# Patient Record
Sex: Male | Born: 2000 | Race: White | Hispanic: No | Marital: Single | Smoking: Never smoker
Health system: Southern US, Community
[De-identification: ages and names within clinical notes are randomized; demographics above are authoritative.]

## PROBLEM LIST (undated history)

## (undated) DIAGNOSIS — K5090 Crohn's disease, unspecified, without complications: Secondary | ICD-10-CM

## (undated) DIAGNOSIS — T7840XA Allergy, unspecified, initial encounter: Secondary | ICD-10-CM

## (undated) DIAGNOSIS — R109 Unspecified abdominal pain: Secondary | ICD-10-CM

## (undated) DIAGNOSIS — D649 Anemia, unspecified: Secondary | ICD-10-CM

## (undated) HISTORY — PX: INGUINAL HERNIA REPAIR: SUR1180

## (undated) HISTORY — DX: Anemia, unspecified: D64.9

## (undated) HISTORY — PX: TESTICLE SURGERY: SHX794

---
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
## 2023-06-06 ENCOUNTER — Telehealth: Payer: Self-pay

## 2023-06-06 ENCOUNTER — Ambulatory Visit (INDEPENDENT_AMBULATORY_CARE_PROVIDER_SITE_OTHER): Payer: 59 | Admitting: Pediatrics

## 2023-06-06 ENCOUNTER — Encounter: Payer: Self-pay | Admitting: Pediatrics

## 2023-06-06 VITALS — BP 120/80 | HR 78 | Ht 71.0 in | Wt 196.0 lb

## 2023-06-06 DIAGNOSIS — Z8619 Personal history of other infectious and parasitic diseases: Secondary | ICD-10-CM | POA: Diagnosis not present

## 2023-06-06 DIAGNOSIS — Z8639 Personal history of other endocrine, nutritional and metabolic disease: Secondary | ICD-10-CM

## 2023-06-06 DIAGNOSIS — Z862 Personal history of diseases of the blood and blood-forming organs and certain disorders involving the immune mechanism: Secondary | ICD-10-CM

## 2023-06-06 DIAGNOSIS — K56609 Unspecified intestinal obstruction, unspecified as to partial versus complete obstruction: Secondary | ICD-10-CM

## 2023-06-06 DIAGNOSIS — Z7960 Long term (current) use of unspecified immunomodulators and immunosuppressants: Secondary | ICD-10-CM

## 2023-06-06 DIAGNOSIS — K50919 Crohn's disease, unspecified, with unspecified complications: Secondary | ICD-10-CM

## 2023-06-06 DIAGNOSIS — K50813 Crohn's disease of both small and large intestine with fistula: Secondary | ICD-10-CM | POA: Diagnosis not present

## 2023-06-06 NOTE — Patient Instructions (Addendum)
 Your provider has requested that you go to the basement level for lab work before leaving today. Press "B" on the elevator. The lab is located at the first door on the left as you exit the elevator.  Due to recent changes in healthcare laws, you may see the results of your imaging and laboratory studies on MyChart before your provider has had a chance to review them.  We understand that in some cases there may be results that are confusing or concerning to you. Not all laboratory results come back in the same time frame and the provider may be waiting for multiple results in order to interpret others.  Please give us  48 hours in order for your provider to thoroughly review all the results before contacting the office for clarification of your results.    Follow up in 6 months.  Thank you for entrusting me with your care and for choosing [facility],  Dr. Eugenia Hess   _______________________________________________________  If your blood pressure at your visit was 140/90 or greater, please contact your primary care physician to follow up on this.  _______________________________________________________  If you are age 21 or older, your body mass index should be between 23-30. Your Body mass index is 27.34 kg/m. If this is out of the aforementioned range listed, please consider follow up with your Primary Care Provider.  If you are age 82 or younger, your body mass index should be between 19-25. Your Body mass index is 27.34 kg/m. If this is out of the aformentioned range listed, please consider follow up with your Primary Care Provider.   ________________________________________________________  The [facility] providers would like to encourage you to use MYCHART to communicate with providers for non-urgent requests or questions.  Due to long hold times on the telephone, sending your provider a message by [facility] may be a faster and more efficient way to get a response.  Please  allow 48 business hours for a response.  Please remember that this is for non-urgent requests.  _______________________________________________________

## 2023-06-06 NOTE — Telephone Encounter (Signed)
 Dr. Yvone Herd requested Kevin Avila's Humira be refilled as adalimumab-ADAZ 40 mg sq every 2 weeks.  For 6 months

## 2023-06-07 MED ORDER — ADALIMUMAB-ADAZ 40 MG/0.4ML ~~LOC~~ SOAJ: 1.0000 | SUBCUTANEOUS | 5 refills | Status: DC

## 2023-06-07 NOTE — Telephone Encounter (Signed)
 adalimumab-ADAZ has been refilled for 6 months.

## 2023-06-11 ENCOUNTER — Encounter: Payer: Self-pay | Admitting: Pediatrics

## 2023-06-19 ENCOUNTER — Telehealth: Payer: Self-pay

## 2023-06-19 ENCOUNTER — Other Ambulatory Visit (HOSPITAL_COMMUNITY): Payer: Self-pay

## 2023-06-19 NOTE — Telephone Encounter (Signed)
 Pharmacy Patient Advocate Encounter   Received notification from CoverMyMeds that prior authorization for Adalimumab -adaz 40MG /0.4ML auto-injectors is required/requested.   Insurance verification completed.   The patient is insured through [facility] .   Per test claim: PA required; PA submitted to above mentioned insurance via CoverMyMeds Key/confirmation #/EOC [id number] Status is pending

## 2023-06-19 NOTE — Telephone Encounter (Signed)
 Noted.

## 2023-06-19 NOTE — Telephone Encounter (Signed)
 Pharmacy Patient Advocate Encounter  Received notification from [employer] that Prior Authorization for Adalimumab -adaz 40MG /0.4ML auto-injectors has been APPROVED from 06-19-2023 to 06-18-2024   PA #/Case ID/Reference #: [id number]

## 2023-07-04 ENCOUNTER — Other Ambulatory Visit: Payer: Self-pay | Admitting: Pediatrics

## 2023-07-05 ENCOUNTER — Ambulatory Visit: Payer: Self-pay | Admitting: Pediatrics

## 2023-07-07 ENCOUNTER — Other Ambulatory Visit: Payer: Self-pay

## 2023-07-07 DIAGNOSIS — K50813 Crohn's disease of both small and large intestine with fistula: Secondary | ICD-10-CM

## 2023-07-10 ENCOUNTER — Other Ambulatory Visit

## 2023-07-10 DIAGNOSIS — K50813 Crohn's disease of both small and large intestine with fistula: Secondary | ICD-10-CM

## 2023-07-11 ENCOUNTER — Encounter: Payer: Self-pay | Admitting: Pediatrics

## 2023-07-11 ENCOUNTER — Ambulatory Visit: Payer: Self-pay | Admitting: Pediatrics

## 2023-07-11 LAB — COMPREHENSIVE METABOLIC PANEL WITH GFR
ALT: 33 IU/L (ref 0–44)
AST: 30 IU/L (ref 0–40)
Albumin: 5 g/dL (ref 4.3–5.2)
Alkaline Phosphatase: 98 IU/L (ref 44–121)
BUN/Creatinine Ratio: 11 (ref 9–20)
BUN: 8 mg/dL (ref 6–20)
Bilirubin Total: 0.2 mg/dL (ref 0.0–1.2)
CO2: 20 mmol/L (ref 20–29)
Calcium: 9.6 mg/dL (ref 8.7–10.2)
Chloride: 102 mmol/L (ref 96–106)
Creatinine, Ser: 0.75 mg/dL — ABNORMAL LOW (ref 0.76–1.27)
Globulin, Total: 2.8 g/dL (ref 1.5–4.5)
Glucose: 82 mg/dL (ref 70–99)
Potassium: 4 mmol/L (ref 3.5–5.2)
Sodium: 138 mmol/L (ref 134–144)
Total Protein: 7.8 g/dL (ref 6.0–8.5)
eGFR: 132 mL/min/{1.73_m2} (ref >59)

## 2023-07-11 LAB — CBC WITH DIFFERENTIAL/PLATELET
Basophils Absolute: 0 10*3/uL (ref 0.0–0.2)
Basos: 0 %
EOS (ABSOLUTE): 0.1 10*3/uL (ref 0.0–0.4)
Eos: 1 %
Hematocrit: 34.6 % — ABNORMAL LOW (ref 37.5–51.0)
Hemoglobin: 9.9 g/dL — ABNORMAL LOW (ref 13.0–17.7)
Immature Grans (Abs): 0 10*3/uL (ref 0.0–0.1)
Immature Granulocytes: 0 %
Lymphocytes Absolute: 2.3 10*3/uL (ref 0.7–3.1)
Lymphs: 33 %
MCH: 19.3 pg — ABNORMAL LOW (ref 26.6–33.0)
MCHC: 28.6 g/dL — ABNORMAL LOW (ref 31.5–35.7)
MCV: 67 fL — ABNORMAL LOW (ref 79–97)
Monocytes Absolute: 0.5 10*3/uL (ref 0.1–0.9)
Monocytes: 7 %
Neutrophils Absolute: 4.1 10*3/uL (ref 1.4–7.0)
Neutrophils: 59 %
Platelets: 179 10*3/uL (ref 150–450)
RBC: 5.13 x10E6/uL (ref 4.14–5.80)
RDW: 13.7 % (ref 11.6–15.4)
WBC: 7 10*3/uL (ref 3.4–10.8)

## 2023-07-11 LAB — QUANTIFERON-TB GOLD PLUS
QuantiFERON Mitogen Value: >10 [IU]/mL
QuantiFERON Nil Value: 0.05 [IU]/mL
QuantiFERON TB1 Ag Value: 0.04 [IU]/mL
QuantiFERON TB2 Ag Value: 0.05 [IU]/mL
QuantiFERON-TB Gold Plus: NEGATIVE

## 2023-07-11 LAB — C-REACTIVE PROTEIN: CRP: <1 mg/L (ref 0–10)

## 2023-07-11 LAB — IRON AND TIBC
Iron Saturation: 2 % — CL (ref 15–55)
Iron: 12 ug/dL — ABNORMAL LOW (ref 38–169)
Total Iron Binding Capacity: 520 ug/dL — ABNORMAL HIGH (ref 250–450)
UIBC: 508 ug/dL — ABNORMAL HIGH (ref 111–343)

## 2023-07-11 LAB — ADALIMUMAB+AB (SERIAL MONITOR)
Adalimumab Drug Level: 8.5 ug/mL
Anti-Adalimumab Antibody: <25 ng/mL

## 2023-07-11 LAB — VITAMIN D 25 HYDROXY (VIT D DEFICIENCY, FRACTURES): Vit D, 25-Hydroxy: 25.7 ng/mL — ABNORMAL LOW (ref 30.0–100.0)

## 2023-07-11 LAB — IGA: Immunoglobulin A: 283 mg/dL (ref 47–310)

## 2023-07-11 LAB — HEPATITIS B CORE ANTIBODY, TOTAL: Hep B Core Total Ab: NEGATIVE

## 2023-07-11 LAB — HEPATITIS B SURFACE ANTIBODY,QUALITATIVE: Hep B Surface Ab, Qual: NONREACTIVE

## 2023-07-11 LAB — TISSUE TRANSGLUTAMINASE ABS,IGG,IGA
(tTG) Ab, IgA: <1 U/mL
(tTG) Ab, IgG: <1 U/mL

## 2023-07-11 LAB — HEPATITIS B SURFACE ANTIGEN: Hepatitis B Surface Ag: NEGATIVE

## 2023-07-11 LAB — B12 AND FOLATE PANEL
Folate: 11.2 ng/mL (ref >3.0)
Vitamin B-12: 366 pg/mL (ref 232–1245)

## 2023-07-11 LAB — SERIAL MONITORING

## 2023-07-11 LAB — FERRITIN: Ferritin: 5 ng/mL — ABNORMAL LOW (ref 30–400)

## 2023-07-11 LAB — SEDIMENTATION RATE: Sed Rate: 26 mm/h — ABNORMAL HIGH (ref 0–15)

## 2023-07-14 ENCOUNTER — Other Ambulatory Visit

## 2023-07-14 DIAGNOSIS — K50813 Crohn's disease of both small and large intestine with fistula: Secondary | ICD-10-CM

## 2023-07-16 LAB — H. PYLORI ANTIGEN, STOOL: H pylori Ag, Stl: NEGATIVE

## 2023-07-17 LAB — CALPROTECTIN, FECAL: Calprotectin, Fecal: 55 ug/g (ref 0–120)

## 2023-08-25 ENCOUNTER — Ambulatory Visit (AMBULATORY_SURGERY_CENTER)

## 2023-08-25 VITALS — Ht 71.0 in | Wt 200.0 lb

## 2023-08-25 DIAGNOSIS — K50813 Crohn's disease of both small and large intestine with fistula: Secondary | ICD-10-CM

## 2023-08-25 MED ORDER — NA SULFATE-K SULFATE-MG SULF 17.5-3.13-1.6 GM/177ML PO SOLN: 1.0000 | Freq: Once | ORAL | 0 refills | Status: AC

## 2023-08-25 NOTE — Progress Notes (Signed)
 No egg or soy allergy known to patient  No issues known to pt with past sedation with any surgeries or procedures Patient denies ever being told they had issues or difficulty with intubation  No FH of Malignant Hyperthermia Pt is not on diet pills Pt is not on  home 02  Pt is not on blood thinners  Pt denies issues with constipation  No A fib or A flutter Have any cardiac testing pending--no Pt can ambulate independently Pt denies use of chewing tobacco Discussed diabetic I weight loss medication holds Discussed NSAID holds Checked BMI Pt instructed to use Singlecare.com or GoodRx for a price reduction on prep  Patient's chart reviewed by Kevin Avila CNRA prior to previsit and patient appropriate for the [facility].  Pre visit completed and red dot placed by patient's name on their procedure day (on provider's schedule).

## 2023-08-28 ENCOUNTER — Encounter: Payer: Self-pay | Admitting: Pediatrics

## 2023-09-08 ENCOUNTER — Encounter: Admitting: Pediatrics

## 2023-09-14 NOTE — Progress Notes (Unsigned)
 [facility] History and Physical   Primary Care Physician:  Podraza, Cole Christopher, PA-C   Reason for Procedure:  History of Crohn's disease, iron deficiency anemia  Plan:    EGD and colonoscopy     HPI: Kevin Avila is a 23 y.o. male undergoing EGD and colonoscopy for a history of Crohn's disease and iron deficiency anemia.  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  He is currently on adalimumab  40 mg subcutaneously q. 14 days.  At the time of his last clinic visit he reported feeling well.  Laboratory studies were noteworthy for iron deficiency anemia with hemoglobin 9.9, iron 12, iron saturation 2, TIBC 508.  Abdomen denies active symptoms of IBD or overt GI bleeding.  Laboratory testing for H. pylori and celiac disease was negative.   Past Medical History:  Diagnosis Date   Abdominal pain, other specified site    Allergy    hx of seasonal allergies   Anemia    Crohn's disease (HCC)     Past Surgical History:  Procedure Laterality Date   COLONOSCOPY  03/02/2012   Procedure: COLONOSCOPY;  Surgeon: Fairy VEAR Gaskins, MD;  Location: [facility] OR;  Service: Gastroenterology;  Laterality: N/A;   COLONOSCOPY  03/02/2012   Procedure: COLONOSCOPY;  Surgeon: Fairy VEAR Gaskins, MD;  Location: [facility] OR;  Service: Gastroenterology;;   ESOPHAGOGASTRODUODENOSCOPY  03/02/2012   Procedure: ESOPHAGOGASTRODUODENOSCOPY (EGD);  Surgeon: Fairy VEAR Gaskins, MD;  Location: [facility] OR;  Service: Gastroenterology;  Laterality: N/A;   INGUINAL HERNIA REPAIR     TESTICLE SURGERY      Prior to Admission medications   Medication Sig Start Date End Date Taking? Authorizing Provider  ACETAMINOPHEN PO Take by mouth.    [provider]  Adalimumab -adaz 40 MG/0.4ML SOAJ Inject 1 pen  into the skin every 14 (fourteen) days. 06/07/23   Suzann Inocente HERO, MD  Cholecalciferol 50 MCG (2000 UT) CAPS Take 2,000 Units  by mouth.    [provider]  cyanocobalamin (VITAMIN B12) 1000 MCG tablet Take 1,000 mcg by mouth. 10/30/19   [provider]  Ferrous Sulfate  (IRON PO) Take by mouth.    [provider]  Hyoscyamine Sulfate (HYOSCYAMINE PO) Take by mouth. Patient not taking: Reported on 08/25/2023    [provider]  Melatonin 10 MG TABS Take by mouth.    [provider]  mercaptopurine  (PURINETHOL ) 50 MG tablet  10/15/13   [provider]  mesalamine (APRISO) 0.375 G 24 hr capsule Take 375 mg by mouth daily. Patient not taking: Reported on 08/25/2023    [provider]  Multiple Vitamins-Iron (MULTIVITAMIN/IRON PO) Take 1 tablet by mouth daily.     [provider]  Probiotic Product (PROBIOTIC PO) Take by mouth.    [provider]    Current Outpatient Medications  Medication Sig Dispense Refill   Melatonin 10 MG TABS Take by mouth.     Probiotic Product (PROBIOTIC PO) Take by mouth.     ACETAMINOPHEN PO Take by mouth.     Adalimumab -adaz 40 MG/0.4ML SOAJ Inject 1 pen  into the skin every 14 (fourteen) days. 0.8 mL 5   Cholecalciferol 50 MCG (2000 UT) CAPS Take 2,000 Units by mouth.     cyanocobalamin (VITAMIN B12) 1000 MCG tablet Take 1,000 mcg by mouth.     Ferrous Sulfate  (IRON PO) Take by mouth.     Hyoscyamine Sulfate (HYOSCYAMINE PO) Take  by mouth. (Patient not taking: Reported on 06/06/2023)     mercaptopurine  (PURINETHOL ) 50 MG tablet  (Patient not taking: Reported on 06/06/2023)     mesalamine (APRISO) 0.375 G 24 hr capsule Take 375 mg by mouth daily. (Patient not taking: Reported on 06/06/2023)     Multiple Vitamins-Iron (MULTIVITAMIN/IRON PO) Take 1 tablet by mouth daily.      Current Facility-Administered Medications  Medication Dose Route Frequency Provider Last Rate Last Admin   0.9 %  sodium chloride  infusion  500 mL Intravenous Once Ayeisha Lindenberger, Inocente HERO, MD        Allergies as of 09/15/2023   (No Known Allergies)     Family History  Problem Relation Age of Onset   Irritable bowel syndrome Mother    Cholelithiasis Mother    Depression Mother    Hearing loss Mother    Diabetes Father    Hyperlipidemia Father    Hypertension Father    Seizures Sister    Asthma Maternal Aunt    Depression Maternal Aunt    Arthritis Maternal Grandmother    Irritable bowel syndrome Maternal Grandfather    Diabetes Paternal Grandmother    Cancer Paternal Grandfather    Liver cancer Neg Hx    Colon polyps Neg Hx    Colon cancer Neg Hx    Esophageal cancer Neg Hx    Rectal cancer Neg Hx    Stomach cancer Neg Hx     Social History   Socioeconomic History   Marital status: Single    Spouse name: Not on file   Number of children: 0   Years of education: Not on file   Highest education level: Not on file  Occupational History   Not on file  Tobacco Use   Smoking status: Never   Smokeless tobacco: Never  Vaping Use   Vaping status: Never Used  Substance and Sexual Activity   Alcohol use: Never   Drug use: Never   Sexual activity: Not on file  Other Topics Concern   Not on file  Social History Narrative   4th grade   Social Drivers of Health   Financial Resource Strain: Not on file  Food Insecurity: Not on file  Transportation Needs: Not on file  Physical Activity: Not on file  Stress: Not on file  Social Connections: Not on file  Intimate Partner Violence: Not At Risk (01/26/2023)   Received from [facility]   HITS    Over the last 12 months how often did your partner physically hurt you?: Never    Over the last 12 months how often did your partner insult you or talk down to you?: Never    Over the last 12 months how often did your partner threaten you with physical harm?: Never    Over the last 12 months how often did your partner scream or curse at you?: Never    Review of Systems:  All other review of systems negative except as mentioned in the HPI.  Physical Exam: Vital  signs BP (!) 132/93   Pulse 78   Temp 98.4 F (36.9 C) (Skin)   Resp 13   Ht 5' 11 (1.803 m)   Wt 200 lb (90.7 kg)   SpO2 100%   BMI 27.89 kg/m   General:   Alert,  Well-developed, well-nourished, pleasant and cooperative in NAD Airway:  Mallampati 2 Lungs:  Clear throughout to auscultation.   Heart:  Regular rate and rhythm; no murmurs, clicks, rubs,  or gallops. Abdomen:  Soft, nontender and nondistended. Normal bowel sounds.   Neuro/Psych:  Normal mood and affect. A and O x 3  Inocente Hausen, MD [facility]

## 2023-09-15 ENCOUNTER — Ambulatory Visit: Admitting: Pediatrics

## 2023-09-15 ENCOUNTER — Other Ambulatory Visit: Payer: Self-pay | Admitting: *Deleted

## 2023-09-15 ENCOUNTER — Encounter: Payer: Self-pay | Admitting: Pediatrics

## 2023-09-15 ENCOUNTER — Other Ambulatory Visit

## 2023-09-15 VITALS — BP 110/62 | HR 78 | Temp 98.4°F | Resp 18 | Ht 71.0 in | Wt 200.0 lb

## 2023-09-15 DIAGNOSIS — D509 Iron deficiency anemia, unspecified: Secondary | ICD-10-CM

## 2023-09-15 DIAGNOSIS — K50813 Crohn's disease of both small and large intestine with fistula: Secondary | ICD-10-CM | POA: Diagnosis not present

## 2023-09-15 DIAGNOSIS — K633 Ulcer of intestine: Secondary | ICD-10-CM

## 2023-09-15 DIAGNOSIS — Z980 Intestinal bypass and anastomosis status: Secondary | ICD-10-CM

## 2023-09-15 DIAGNOSIS — K2950 Unspecified chronic gastritis without bleeding: Secondary | ICD-10-CM

## 2023-09-15 LAB — CBC WITH DIFFERENTIAL/PLATELET
Basophils Absolute: 0.1 K/uL (ref 0.0–0.1)
Basophils Relative: 1.1 % (ref 0.0–3.0)
Eosinophils Absolute: 0.1 K/uL (ref 0.0–0.7)
Eosinophils Relative: 1.3 % (ref 0.0–5.0)
HCT: 34.6 % — ABNORMAL LOW (ref 39.0–52.0)
Hemoglobin: 10.7 g/dL — ABNORMAL LOW (ref 13.0–17.0)
Lymphocytes Relative: 29.4 % (ref 12.0–46.0)
Lymphs Abs: 1.6 K/uL (ref 0.7–4.0)
MCHC: 31 g/dL (ref 30.0–36.0)
MCV: 67.2 fl — ABNORMAL LOW (ref 78.0–100.0)
Monocytes Absolute: 0.4 K/uL (ref 0.1–1.0)
Monocytes Relative: 6.4 % (ref 3.0–12.0)
Neutro Abs: 3.4 K/uL (ref 1.4–7.7)
Neutrophils Relative %: 61.8 % (ref 43.0–77.0)
Platelets: 133 K/uL — ABNORMAL LOW (ref 150.0–400.0)
RBC: 5.15 Mil/uL (ref 4.22–5.81)
RDW: 17.7 % — ABNORMAL HIGH (ref 11.5–15.5)
WBC: 5.6 K/uL (ref 4.0–10.5)

## 2023-09-15 LAB — IBC + FERRITIN
Ferritin: 4.4 ng/mL — ABNORMAL LOW (ref 22.0–322.0)
Iron: 26 ug/dL — ABNORMAL LOW (ref 42–165)
Saturation Ratios: 5 % — ABNORMAL LOW (ref 20.0–50.0)
TIBC: 520.8 ug/dL — ABNORMAL HIGH (ref 250.0–450.0)
Transferrin: 372 mg/dL — ABNORMAL HIGH (ref 212.0–360.0)

## 2023-09-15 MED ORDER — SODIUM CHLORIDE 0.9 % IV SOLN: 500.0000 mL | Freq: Once | INTRAVENOUS | Status: DC

## 2023-09-15 NOTE — Op Note (Signed)
 [facility] Patient Name: Kevin Avila Procedure Date: 09/15/2023 9:28 AM MRN: [id number] Endoscopist: Inocente Hausen , MD, [id number] Age: 23 Referring MD:  Date of Birth: [birth year] Gender: Male Account #: [id number] Procedure:                Upper GI endoscopy Indications:              Iron deficiency anemia, Crohn's disease                            -stricturing small bowel, colonic and perianal                            fistulizing Crohn's disease status post ileocecal                            resection 2014; currently on adalimumab  40 mg                            subcutaneously q. 14 days, recent fecal                            calprotectin 55 Medicines:                Monitored Anesthesia Care Procedure:                Pre-Anesthesia Assessment:                           - Prior to the procedure, a History and Physical                            was performed, and patient medications and                            allergies were reviewed. The patient's tolerance of                            previous anesthesia was also reviewed. The risks                            and benefits of the procedure and the sedation                            options and risks were discussed with the patient.                            All questions were answered, and informed consent                            was obtained. Prior Anticoagulants: The patient has                            taken no anticoagulant or antiplatelet agents. ASA  Grade Assessment: II - A patient with mild systemic                            disease. After reviewing the risks and benefits,                            the patient was deemed in satisfactory condition to                            undergo the procedure.                           After obtaining informed consent, the endoscope was                            passed under direct vision. Throughout the                             procedure, the patient's blood pressure, pulse, and                            oxygen saturations were monitored continuously. The                            Olympus Scope [id number] was introduced through the                            mouth, and advanced to the second part of duodenum.                            The upper GI endoscopy was accomplished without                            difficulty. The patient tolerated the procedure                            well. Scope In: Scope Out: Findings:                 The examined esophagus was normal.                           The gastric body, gastric antrum, cardia (on                            retroflexion) and gastric fundus (on retroflexion)                            were normal. Biopsies were taken with a cold                            forceps for Helicobacter pylori testing.                           The duodenal bulb and second portion of the  duodenum were normal. Biopsies for histology were                            taken with a cold forceps for evaluation of celiac                            disease. Complications:            No immediate complications. Estimated blood loss:                            Minimal. Estimated Blood Loss:     Estimated blood loss was minimal. Impression:               - Normal esophagus.                           - Normal gastric body, antrum, cardia and gastric                            fundus. Biopsied.                           - Normal duodenal bulb and second portion of the                            duodenum. Biopsied. Recommendation:           - Await pathology results.                           - Perform a colonoscopy today.                           - The findings and recommendations were discussed                            with the patient's family. Inocente Hausen, MD 09/15/2023 9:59:55 AM This report has been signed electronically.

## 2023-09-15 NOTE — Progress Notes (Signed)
 Called to room to assist during endoscopic procedure.  Patient ID and intended procedure confirmed with present staff. Received instructions for my participation in the procedure from the performing physician.

## 2023-09-15 NOTE — Progress Notes (Signed)
8350 Ephedrine 10 mg given IV due to low BP, MD updated.

## 2023-09-15 NOTE — Progress Notes (Signed)
 Report given to PACU, vss

## 2023-09-15 NOTE — Op Note (Signed)
 [facility] Patient Name: Kevin Avila Procedure Date: 09/15/2023 9:28 AM MRN: [id number] Endoscopist: Inocente Hausen , MD, [id number] Age: 23 Referring MD:  Date of Birth: [birth year] Gender: Male Account #: [id number] Procedure:                Colonoscopy Indications:              Iron deficiency anemia, Disease activity assessment                            of Crohn's disease of the small bowel and colon -                            stricturing small bowel, colonic and perianal                            fistulizing Crohn's disease status post ileocecal                            resection/right hemicolectomy 2014; currently on                            adalimumab  40 mg subcutaneously q. 14 days, recent                            fecal calprotectin 55, patient denies active IBD                            symptoms or overt bleeding at the time of this exam. Medicines:                Monitored Anesthesia Care Procedure:                Pre-Anesthesia Assessment:                           - Prior to the procedure, a History and Physical                            was performed, and patient medications and                            allergies were reviewed. The patient's tolerance of                            previous anesthesia was also reviewed. The risks                            and benefits of the procedure and the sedation                            options and risks were discussed with the patient.                            All questions were answered, and informed consent  was obtained. Prior Anticoagulants: The patient has                            taken no anticoagulant or antiplatelet agents. ASA                            Grade Assessment: II - A patient with mild systemic                            disease. After reviewing the risks and benefits,                            the patient was deemed in satisfactory condition to                             undergo the procedure.                           After obtaining informed consent, the colonoscope                            was passed under direct vision. Throughout the                            procedure, the patient's blood pressure, pulse, and                            oxygen saturations were monitored continuously. The                            CF [id number] #[id number] was introduced through the anus                            and advanced to the the terminal ileum. The                            colonoscopy was performed without difficulty. The                            patient tolerated the procedure well. The quality                            of the bowel preparation was adequate. The terminal                            ileum and the rectum were photographed. Scope In: 9:41:45 AM Scope Out: 9:57:46 AM Scope Withdrawal Time: 0 hours 12 minutes 25 seconds  Total Procedure Duration: 0 hours 16 minutes 1 second  Findings:                 The perianal and digital rectal examinations were                            normal. Pertinent negatives include normal  sphincter tone and no palpable rectal lesions.                            Scarring was seen in the superior aspect of the                            gluteal cleft presumably related to prior history                            lysing Crohn's disease                           There was evidence of a prior ileo-colonic                            anastomosis in the transverse colon. This was                            patent and was characterized by marginal                            ulceration. Ulceration appeared to be stable                            compared to prior colonoscopies. The anastomosis                            was traversed.                           The neo-terminal ileum appeared normal. Rutgeerts                            i0. Biopsies were taken with a cold forceps for                             histology.                           The colon (entire examined portion) appeared                            normal. Four biopsies were taken every 10 cm with a                            cold forceps from the transverse colon, descending                            colon and rectosigmoid colon for Crohn's disease                            surveillance. These biopsy specimens were sent to                            Pathology.  The retroflexed view of the distal rectum and anal                            verge was normal and showed no anal or rectal                            abnormalities. Complications:            No immediate complications. Estimated blood loss:                            Minimal. Estimated Blood Loss:     Estimated blood loss was minimal. Impression:               - Patent ileo-colonic anastomosis, characterized by                            marginal ulceration that appears to be stable                            compared to prior colonoscopy. It is possible that                            marginal ulceration could be causing microscopic                            shedding of blood contributing to iron deficiency                            anemia.                           - The examined portion of the ileum was normal.                            Rutgeerts i0. Biopsied.                           - The entire examined colon is normal. Biopsied.                           - The distal rectum and anal verge are normal on                            retroflexion view. Recommendation:           - Discharge patient to home (ambulatory).                           - Await pathology results.                           - Repeat colonoscopy for surveillance based on                            pathology results.                           -  Check follow-up CBC, iron, TIBC and ferritin today                           - The findings and  recommendations were discussed                            with the patient's family.                           - Return to GI clinic ~ November 2026 with Dr.                            Suzann or APP.                           - Patient has a contact number available for                            emergencies. The signs and symptoms of potential                            delayed complications were discussed with the                            patient. Return to normal activities tomorrow.                            Written discharge instructions were provided to the                            patient. Inocente Suzann, MD 09/15/2023 10:08:33 AM This report has been signed electronically.

## 2023-09-15 NOTE — Progress Notes (Signed)
 VS by CW  Pt's states no medical or surgical changes since previsit or office visit.

## 2023-09-15 NOTE — Progress Notes (Signed)
 Received a call from [place], RN in [facility] stating that Dr Suzann has ordered CBC, IBC, Ferritin, iron on patient for diagnosis: crohns.  Orders entered in EPIC as per Dr Andy request.

## 2023-09-15 NOTE — Patient Instructions (Addendum)
   Await results of gastric & duodenal biopsies   Await results of colon biopsies   Lab work to be done today prior to discharge    YOU HAD AN ENDOSCOPIC PROCEDURE TODAY AT THE [facility]:   Refer to the procedure report that was given to you for any specific questions about what was found during the examination.  If the procedure report does not answer your questions, please call your gastroenterologist to clarify.  If you requested that your care partner not be given the details of your procedure findings, then the procedure report has been included in a sealed envelope for you to review at your convenience later.  YOU SHOULD EXPECT: Some feelings of bloating in the abdomen. Passage of more gas than usual.  Walking can help get rid of the air that was put into your GI tract during the procedure and reduce the bloating. If you had a lower endoscopy (such as a colonoscopy or flexible sigmoidoscopy) you may notice spotting of blood in your stool or on the toilet paper. If you underwent a bowel prep for your procedure, you may not have a normal bowel movement for a few days.  Please Note:  You might notice some irritation and congestion in your nose or some drainage.  This is from the oxygen used during your procedure.  There is no need for concern and it should clear up in a day or so.  SYMPTOMS TO REPORT IMMEDIATELY:  Following lower endoscopy (colonoscopy or flexible sigmoidoscopy):  Excessive amounts of blood in the stool  Significant tenderness or worsening of abdominal pains  Swelling of the abdomen that is new, acute  Fever of 100F or higher  Following upper endoscopy (EGD)  Vomiting of blood or coffee ground material  New chest pain or pain under the shoulder blades  Painful or persistently difficult swallowing  New shortness of breath  Fever of 100F or higher  Black, tarry-looking stools  For urgent or emergent issues, a gastroenterologist can be reached at any  hour by calling [phone]. Do not use MyChart messaging for urgent concerns.    DIET:  We do recommend a small meal at first, but then you may proceed to your regular diet.  Drink plenty of fluids but you should avoid alcoholic beverages for 24 hours.  ACTIVITY:  You should plan to take it easy for the rest of today and you should NOT DRIVE or use heavy machinery until tomorrow (because of the sedation medicines used during the test).    FOLLOW UP: Our staff will call the number listed on your records the next business day following your procedure.  We will call around 7:15- 8:00 am to check on you and address any questions or concerns that you may have regarding the information given to you following your procedure. If we do not reach you, we will leave a message.     If any biopsies were taken you will be contacted by phone or by letter within the next 1-3 weeks.  Please call us  at [phone] if you have not heard about the biopsies in 3 weeks.    SIGNATURES/CONFIDENTIALITY: You and/or your care partner have signed paperwork which will be entered into your electronic medical record.  These signatures attest to the fact that that the information above on your After Visit Summary has been reviewed and is understood.  Full responsibility of the confidentiality of this discharge information lies with you and/or your care-partner.

## 2023-09-15 NOTE — Progress Notes (Signed)
[id number] Ephedrine 10 mg given IV due to low BP, MD updated.

## 2023-09-18 ENCOUNTER — Ambulatory Visit: Payer: Self-pay | Admitting: Pediatrics

## 2023-09-18 ENCOUNTER — Telehealth: Payer: Self-pay

## 2023-09-18 DIAGNOSIS — Z7960 Long term (current) use of unspecified immunomodulators and immunosuppressants: Secondary | ICD-10-CM

## 2023-09-18 DIAGNOSIS — D509 Iron deficiency anemia, unspecified: Secondary | ICD-10-CM

## 2023-09-18 NOTE — Telephone Encounter (Signed)
 Left message on follow up call.

## 2023-09-19 LAB — SURGICAL PATHOLOGY

## 2023-09-20 ENCOUNTER — Telehealth: Payer: Self-pay

## 2023-09-20 ENCOUNTER — Ambulatory Visit: Payer: Self-pay | Admitting: Pediatrics

## 2023-09-20 DIAGNOSIS — K50813 Crohn's disease of both small and large intestine with fistula: Secondary | ICD-10-CM

## 2023-09-20 NOTE — Telephone Encounter (Signed)
 MR enterography order in epic. Urgent secure staff message sent to radiology scheduling to contact patient to set up MRE. Patient notified via MyChart.

## 2023-09-20 NOTE — Telephone Encounter (Signed)
-----   Message from Kevin Avila CHRISTELLA Hausen sent at 09/19/2023  4:53 PM EDT ----- Regarding: MRE scheduling If Mr. Budney has not yet been scheduled for MR enterography, please go ahead and schedule this for him.  Indication is to evaluate for active small bowel Crohn's disease  Thanks,  Kevin Avila

## 2023-09-29 ENCOUNTER — Inpatient Hospital Stay: Attending: Family | Admitting: Family

## 2023-09-29 ENCOUNTER — Ambulatory Visit (HOSPITAL_COMMUNITY)
Admission: RE | Admit: 2023-09-29 | Discharge: 2023-09-29 | Disposition: A | Discharge status: Home / Self Care | Source: Ambulatory Visit | Attending: Pediatrics | Admitting: Pediatrics

## 2023-09-29 ENCOUNTER — Inpatient Hospital Stay

## 2023-09-29 ENCOUNTER — Encounter: Payer: Self-pay | Admitting: Family

## 2023-09-29 VITALS — BP 123/70 | HR 88 | Temp 98.3°F | Resp 17 | Ht 71.0 in | Wt 202.0 lb

## 2023-09-29 DIAGNOSIS — Z7960 Long term (current) use of unspecified immunomodulators and immunosuppressants: Secondary | ICD-10-CM | POA: Diagnosis not present

## 2023-09-29 DIAGNOSIS — K50819 Crohn's disease of both small and large intestine with unspecified complications: Secondary | ICD-10-CM | POA: Diagnosis not present

## 2023-09-29 DIAGNOSIS — Z79899 Other long term (current) drug therapy: Secondary | ICD-10-CM | POA: Insufficient documentation

## 2023-09-29 DIAGNOSIS — K909 Intestinal malabsorption, unspecified: Secondary | ICD-10-CM | POA: Insufficient documentation

## 2023-09-29 DIAGNOSIS — D509 Iron deficiency anemia, unspecified: Secondary | ICD-10-CM | POA: Insufficient documentation

## 2023-09-29 DIAGNOSIS — K5090 Crohn's disease, unspecified, without complications: Secondary | ICD-10-CM | POA: Insufficient documentation

## 2023-09-29 DIAGNOSIS — K50813 Crohn's disease of both small and large intestine with fistula: Secondary | ICD-10-CM | POA: Insufficient documentation

## 2023-09-29 DIAGNOSIS — D84821 Immunodeficiency due to drugs: Secondary | ICD-10-CM | POA: Diagnosis not present

## 2023-09-29 DIAGNOSIS — D500 Iron deficiency anemia secondary to blood loss (chronic): Secondary | ICD-10-CM | POA: Diagnosis not present

## 2023-09-29 MED ORDER — GADOBUTROL 1 MMOL/ML IV SOLN
9.0000 mL | Freq: Once | INTRAVENOUS | Status: AC | PRN
  Administered 2023-09-29: 9 mL via INTRAVENOUS

## 2023-09-29 NOTE — Progress Notes (Signed)
 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  He was diagnosed around 23 yo at [facility] with Crohn's. He is currently on a generic Humira  injection every 2 weeks.  He states that he has required a blood transfusion once in the past for significant anemia with Crohn's flare when he was younger.  He had his EGD and colonoscopy on 09/15/2023. EGD was normal. Colonoscopy ieo-colonic anastomosis with marginal ulceration that was considered stable but may be causing microscopic shedding of blood contributing to IDA.   No obvious blood blood loss. No abnormal bruising, no petechiae.  Iron saturation is 5% and ferritin 4. His Hgb is 10.7, MCV 67, platelets 133 and WBC count 5.6.  He has tried to start exercising with his sister and dad at the gym and notes muscle weakness, fatigue/poor stamina as well as SOB and palpitations with exertion.  His paternal grandmother also had history of anemia and a rare for of psoriasis.  He states that past surgical history includes hernia repair, orchiopexy and partial colectomy of 1/3 of the large intestine and 12 inches of the small intestine.  No history of diabetes or thyroid disease.  No cancer history.  No issue with frequent or recurrent infections. No fever, chills, n/v, cough, rash, chest pain, abdominal pain or changes in bowel or bladder habits.  No swelling, tenderness, numbness or tingling in his extremities.  No falls or syncope.   No smoking, ETOH or recreational drug use.  Appetite and hydration are good. Weight is stable at 202 lbs.  He works at [employer] the [employer].   ROS: All other 10 point review of systems is negative.   PAST MEDICAL HISTORY:   Past Medical History:  Diagnosis Date   Abdominal pain, other specified site    Allergy    hx of seasonal allergies   Anemia    Crohn's disease (HCC)     ALLERGIES: No Known Allergies    MEDICATIONS:  Current Outpatient Medications on File Prior to Visit  Medication Sig Dispense Refill   ACETAMINOPHEN PO Take by mouth.     Cholecalciferol 50 MCG (2000 UT) CAPS Take 2,000 Units by mouth.     cyanocobalamin (VITAMIN B12) 1000 MCG tablet Take 1,000 mcg by mouth.     Melatonin 10 MG TABS Take by mouth.     Multiple Vitamins-Iron (MULTIVITAMIN/IRON PO) Take 1 tablet by mouth daily.      Probiotic Product (PROBIOTIC PO) Take by mouth.     Adalimumab -adaz 40 MG/0.4ML SOAJ Inject 1 pen  into the skin every 14 (fourteen) days. (Patient not taking: Reported on 09/29/2023) 0.8 mL 5   Ferrous Sulfate  (IRON PO) Take by mouth. (Patient not taking: Reported on 09/29/2023)     Hyoscyamine Sulfate (HYOSCYAMINE PO) Take by mouth. (Patient not taking: Reported on 09/29/2023)     mercaptopurine  (PURINETHOL ) 50 MG tablet  (Patient not taking: Reported on 09/29/2023)     mesalamine (APRISO)  0.375 G 24 hr capsule Take 375 mg by mouth daily. (Patient not taking: Reported on 09/29/2023)     No current facility-administered medications on file prior to visit.     PAST SURGICAL HISTORY Past Surgical History:  Procedure Laterality Date   COLONOSCOPY  03/02/2012   Procedure: COLONOSCOPY;  Surgeon: Fairy VEAR Gaskins, MD;  Location: [facility] OR;  Service: Gastroenterology;  Laterality: N/A;   COLONOSCOPY  03/02/2012   Procedure: COLONOSCOPY;  Surgeon: Fairy VEAR Gaskins, MD;  Location: [facility] OR;  Service: Gastroenterology;;   ESOPHAGOGASTRODUODENOSCOPY  03/02/2012   Procedure:  ESOPHAGOGASTRODUODENOSCOPY (EGD);  Surgeon: Fairy VEAR Gaskins, MD;  Location: [facility] OR;  Service: Gastroenterology;  Laterality: N/A;   INGUINAL HERNIA REPAIR     TESTICLE SURGERY      FAMILY HISTORY: Family History  Problem Relation Age of Onset   Irritable bowel syndrome Mother    Cholelithiasis Mother    Depression Mother    Hearing loss Mother    Diabetes Father    Hyperlipidemia Father    Hypertension Father    Seizures Sister    Asthma Maternal Aunt    Depression Maternal Aunt    Arthritis Maternal Grandmother    Irritable bowel syndrome Maternal Grandfather    Diabetes Paternal Grandmother    Cancer Paternal Grandfather    Liver cancer Neg Hx    Colon polyps Neg Hx    Colon cancer Neg Hx    Esophageal cancer Neg Hx    Rectal cancer Neg Hx    Stomach cancer Neg Hx     SOCIAL HISTORY:  reports that he has never smoked. He has never used smokeless tobacco. He reports that he does not drink alcohol and does not use drugs.  PERFORMANCE STATUS: The patient's performance status is 1 - Symptomatic but completely ambulatory  PHYSICAL EXAM: Most Recent Vital Signs: Blood pressure 123/70, pulse 88, temperature 98.3 F (36.8 C), temperature source Oral, resp. rate 17, height 5' 11 (1.803 m), weight 202 lb (91.6 kg), SpO2 97%. BP 123/70 (BP Location: Right Arm, Patient Position: Sitting)   Pulse 88   Temp 98.3 F (36.8 C) (Oral)   Resp 17   Ht 5' 11 (1.803 m)   Wt 202 lb (91.6 kg)   SpO2 97%   BMI 28.17 kg/m   General Appearance:    Alert, cooperative, no distress, appears stated age  Head:    Normocephalic, without obvious abnormality, atraumatic  Eyes:    PERRL, conjunctiva/corneas clear, EOM's intact, fundi    benign, both eyes             Throat:   Lips, mucosa, and tongue normal; teeth and gums normal  Neck:   Supple, symmetrical, trachea midline, no adenopathy;       thyroid:  No enlargement/tenderness/nodules; no carotid   bruit or JVD  Back:     Symmetric, no  curvature, ROM normal, no CVA tenderness  Lungs:     Clear to auscultation bilaterally, respirations unlabored  Chest wall:    No tenderness or deformity  Heart:    Regular rate and rhythm, S1 and S2 normal, no murmur, rub   or gallop  Abdomen:     Soft, non-tender, bowel sounds active all four quadrants,    no masses, no organomegaly        Extremities:   Extremities normal, atraumatic, no cyanosis or edema  Pulses:   2+ and symmetric all extremities  Skin:   Skin color, texture, turgor normal,  no rashes or lesions  Lymph nodes:   Cervical, supraclavicular, and axillary nodes normal  Neurologic:   CNII-XII intact. Normal strength, sensation and reflexes      throughout    LABORATORY DATA:  No results found for this or any previous visit (from the past 48 hours).    RADIOGRAPHY: No results found.     PATHOLOGY: None  ASSESSMENT/PLAN: Kevin Avila is a very pleasant 23 yo caucasian gentleman IDA felt to be secondary to history of Crohn's with intermittent GI blood loss and iron malabsorption.  We will get him set up for 3 doses of IV iron and follow-up in 8 weeks.   All questions were answered. The patient knows to call the clinic with any problems, questions or concerns. We can certainly see the patient much sooner if necessary.   Lauraine Pepper, NP

## 2023-09-30 ENCOUNTER — Encounter: Payer: Self-pay | Admitting: Family

## 2023-10-01 ENCOUNTER — Ambulatory Visit: Payer: Self-pay | Admitting: Pediatrics

## 2023-10-06 ENCOUNTER — Inpatient Hospital Stay

## 2023-10-06 VITALS — BP 110/60 | HR 81 | Temp 98.1°F | Resp 17

## 2023-10-06 DIAGNOSIS — K909 Intestinal malabsorption, unspecified: Secondary | ICD-10-CM

## 2023-10-06 DIAGNOSIS — D509 Iron deficiency anemia, unspecified: Secondary | ICD-10-CM | POA: Diagnosis not present

## 2023-10-06 DIAGNOSIS — K50819 Crohn's disease of both small and large intestine with unspecified complications: Secondary | ICD-10-CM

## 2023-10-06 DIAGNOSIS — D500 Iron deficiency anemia secondary to blood loss (chronic): Secondary | ICD-10-CM

## 2023-10-06 MED ORDER — IRON SUCROSE 300 MG IVPB - SIMPLE MED
300.0000 mg | Freq: Once | Status: AC
  Administered 2023-10-06: 300 mg via INTRAVENOUS
  Filled 2023-10-06: qty 300

## 2023-10-06 MED ORDER — SODIUM CHLORIDE 0.9 % IV SOLN: INTRAVENOUS | Status: DC

## 2023-10-06 NOTE — Patient Instructions (Signed)

## 2023-10-13 ENCOUNTER — Inpatient Hospital Stay: Attending: Hematology & Oncology

## 2023-10-13 VITALS — BP 115/71 | HR 73 | Temp 98.6°F | Resp 17

## 2023-10-13 DIAGNOSIS — Z79899 Other long term (current) drug therapy: Secondary | ICD-10-CM | POA: Insufficient documentation

## 2023-10-13 DIAGNOSIS — K909 Intestinal malabsorption, unspecified: Secondary | ICD-10-CM

## 2023-10-13 DIAGNOSIS — D509 Iron deficiency anemia, unspecified: Secondary | ICD-10-CM | POA: Diagnosis present

## 2023-10-13 DIAGNOSIS — K50819 Crohn's disease of both small and large intestine with unspecified complications: Secondary | ICD-10-CM

## 2023-10-13 DIAGNOSIS — D500 Iron deficiency anemia secondary to blood loss (chronic): Secondary | ICD-10-CM

## 2023-10-13 MED ORDER — SODIUM CHLORIDE 0.9 % IV SOLN
300.0000 mg | Freq: Once | INTRAVENOUS | Status: AC
  Administered 2023-10-13: 300 mg via INTRAVENOUS
  Filled 2023-10-13: qty 300

## 2023-10-13 MED ORDER — IRON SUCROSE 300 MG IVPB - SIMPLE MED: 300.0000 mg | Freq: Once | Status: DC

## 2023-10-13 MED ORDER — SODIUM CHLORIDE 0.9 % IV SOLN: INTRAVENOUS | Status: DC

## 2023-10-13 NOTE — Patient Instructions (Signed)

## 2023-10-18 ENCOUNTER — Encounter: Payer: Self-pay | Admitting: Physician Assistant

## 2023-10-20 ENCOUNTER — Inpatient Hospital Stay

## 2023-10-20 VITALS — BP 103/65 | HR 84 | Temp 98.5°F | Resp 20

## 2023-10-20 DIAGNOSIS — K909 Intestinal malabsorption, unspecified: Secondary | ICD-10-CM

## 2023-10-20 DIAGNOSIS — D500 Iron deficiency anemia secondary to blood loss (chronic): Secondary | ICD-10-CM

## 2023-10-20 DIAGNOSIS — K50819 Crohn's disease of both small and large intestine with unspecified complications: Secondary | ICD-10-CM

## 2023-10-20 DIAGNOSIS — D509 Iron deficiency anemia, unspecified: Secondary | ICD-10-CM | POA: Diagnosis not present

## 2023-10-20 MED ORDER — SODIUM CHLORIDE 0.9 % IV SOLN: INTRAVENOUS | Status: DC

## 2023-10-20 MED ORDER — IRON SUCROSE 300 MG IVPB - SIMPLE MED
300.0000 mg | Freq: Once | Status: AC
  Administered 2023-10-20: 300 mg via INTRAVENOUS
  Filled 2023-10-20: qty 300

## 2023-10-20 NOTE — Patient Instructions (Signed)
 [facility] - A DEPT OF [facility]  Discharge Instructions: Thank you for choosing [facility] to provide your oncology and hematology care.   If you have a lab appointment with the Cancer Center, please go directly to the Cancer Center and check in at the registration area.  Wear comfortable clothing and clothing appropriate for easy access to any Portacath or PICC line.   We strive to give you quality time with your provider. You may need to reschedule your appointment if you arrive late (15 or more minutes).  Arriving late affects you and other patients whose appointments are after yours.  Also, if you miss three or more appointments without notifying the office, you may be dismissed from the clinic at the provider's discretion.      For prescription refill requests, have your pharmacy contact our office and allow 72 hours for refills to be completed.    Today you received the following  agents Venofer      To help prevent nausea and vomiting after your treatment, we encourage you to take your nausea medication as directed.  BELOW ARE SYMPTOMS THAT SHOULD BE REPORTED IMMEDIATELY: *FEVER GREATER THAN 100.4 F (38 C) OR HIGHER *CHILLS OR SWEATING *NAUSEA AND VOMITING THAT IS NOT CONTROLLED WITH YOUR NAUSEA MEDICATION *UNUSUAL SHORTNESS OF BREATH *UNUSUAL BRUISING OR BLEEDING *URINARY PROBLEMS (pain or burning when urinating, or frequent urination) *BOWEL PROBLEMS (unusual diarrhea, constipation, pain near the anus) TENDERNESS IN MOUTH AND THROAT WITH OR WITHOUT PRESENCE OF ULCERS (sore throat, sores in mouth, or a toothache) UNUSUAL RASH, SWELLING OR PAIN  UNUSUAL VAGINAL DISCHARGE OR ITCHING   Items with * indicate a potential emergency and should be followed up as soon as possible or go to the Emergency Department if any problems should occur.  Please show the CHEMOTHERAPY ALERT CARD or IMMUNOTHERAPY ALERT CARD at check-in to the  Emergency Department and triage nurse. Should you have questions after your visit or need to cancel or reschedule your appointment, please contact [facility] - A DEPT OF [facility]  [phone] and follow the prompts.  Office hours are 8:00 a.m. to 4:30 p.m. Monday - Friday. Please note that voicemails left after 4:00 p.m. may not be returned until the following business day.  We are closed weekends and major holidays. You have access to a nurse at all times for urgent questions. Please call the main number to the clinic [phone] and follow the prompts.  For any non-urgent questions, you may also contact your provider using MyChart. We now offer e-Visits for anyone 79 and older to request care online for non-urgent symptoms. For details visit [email or web address].   Also download the MyChart app! Go to the app store, search "MyChart", open the app, select , and log in with your MyChart username and password.

## 2023-11-22 ENCOUNTER — Other Ambulatory Visit: Payer: Self-pay | Admitting: Pediatrics

## 2023-11-24 ENCOUNTER — Encounter: Payer: Self-pay | Admitting: Family

## 2023-11-24 ENCOUNTER — Inpatient Hospital Stay: Admitting: Family

## 2023-11-24 ENCOUNTER — Inpatient Hospital Stay: Attending: Hematology & Oncology

## 2023-11-24 VITALS — BP 121/76 | HR 92 | Temp 98.2°F | Resp 20 | Ht 71.0 in | Wt 200.4 lb

## 2023-11-24 DIAGNOSIS — D500 Iron deficiency anemia secondary to blood loss (chronic): Secondary | ICD-10-CM | POA: Diagnosis not present

## 2023-11-24 DIAGNOSIS — D509 Iron deficiency anemia, unspecified: Secondary | ICD-10-CM | POA: Insufficient documentation

## 2023-11-24 DIAGNOSIS — K50819 Crohn's disease of both small and large intestine with unspecified complications: Secondary | ICD-10-CM

## 2023-11-24 DIAGNOSIS — Z79899 Other long term (current) drug therapy: Secondary | ICD-10-CM | POA: Insufficient documentation

## 2023-11-24 DIAGNOSIS — K5090 Crohn's disease, unspecified, without complications: Secondary | ICD-10-CM | POA: Insufficient documentation

## 2023-11-24 DIAGNOSIS — K909 Intestinal malabsorption, unspecified: Secondary | ICD-10-CM | POA: Diagnosis not present

## 2023-11-24 LAB — CBC WITH DIFFERENTIAL (CANCER CENTER ONLY)
Abs Immature Granulocytes: 0.02 K/uL (ref 0.00–0.07)
Basophils Absolute: 0.1 K/uL (ref 0.0–0.1)
Basophils Relative: 1 %
Eosinophils Absolute: 0.1 K/uL (ref 0.0–0.5)
Eosinophils Relative: 1 %
HCT: 43.8 % (ref 39.0–52.0)
Hemoglobin: 14.3 g/dL (ref 13.0–17.0)
Immature Granulocytes: 0 %
Lymphocytes Relative: 25 %
Lymphs Abs: 2.1 K/uL (ref 0.7–4.0)
MCH: 24.7 pg — ABNORMAL LOW (ref 26.0–34.0)
MCHC: 32.6 g/dL (ref 30.0–36.0)
MCV: 75.6 fL — ABNORMAL LOW (ref 80.0–100.0)
Monocytes Absolute: 0.6 K/uL (ref 0.1–1.0)
Monocytes Relative: 7 %
Neutro Abs: 5.6 K/uL (ref 1.7–7.7)
Neutrophils Relative %: 66 %
Platelet Count: 173 K/uL (ref 150–400)
RBC: 5.79 MIL/uL (ref 4.22–5.81)
RDW: 19.5 % — ABNORMAL HIGH (ref 11.5–15.5)
WBC Count: 8.4 K/uL (ref 4.0–10.5)
nRBC: 0 % (ref 0.0–0.2)

## 2023-11-24 LAB — CMP (CANCER CENTER ONLY)
ALT: 36 U/L (ref 0–44)
AST: 27 U/L (ref 15–41)
Albumin: 5.1 g/dL — ABNORMAL HIGH (ref 3.5–5.0)
Alkaline Phosphatase: 90 U/L (ref 38–126)
Anion gap: 13 (ref 5–15)
BUN: 10 mg/dL (ref 6–20)
CO2: 23 mmol/L (ref 22–32)
Calcium: 9.8 mg/dL (ref 8.9–10.3)
Chloride: 102 mmol/L (ref 98–111)
Creatinine: 0.84 mg/dL (ref 0.61–1.24)
GFR, Estimated: >60 mL/min (ref >60)
Glucose, Bld: 94 mg/dL (ref 70–99)
Potassium: 4 mmol/L (ref 3.5–5.1)
Sodium: 138 mmol/L (ref 135–145)
Total Bilirubin: 0.3 mg/dL (ref 0.0–1.2)
Total Protein: 7.9 g/dL (ref 6.5–8.1)

## 2023-11-24 LAB — IRON AND IRON BINDING CAPACITY (CC-WL,HP ONLY)
Iron: 39 ug/dL — ABNORMAL LOW (ref 45–182)
Saturation Ratios: 8 % — ABNORMAL LOW (ref 17.9–39.5)
TIBC: 479 ug/dL — ABNORMAL HIGH (ref 250–450)
UIBC: 440 ug/dL

## 2023-11-24 LAB — FERRITIN: Ferritin: 64 ng/mL (ref 24–336)

## 2023-11-24 LAB — RETICULOCYTES
Immature Retic Fract: 9.9 % (ref 2.3–15.9)
RBC.: 5.75 MIL/uL (ref 4.22–5.81)
Retic Count, Absolute: 60.4 K/uL (ref 19.0–186.0)
Retic Ct Pct: 1.1 % (ref 0.4–3.1)

## 2023-11-24 NOTE — Progress Notes (Signed)
 Hematology and Oncology Follow Up Visit  Kevin Avila [id number] 01/17/2001 23 y.o. 11/24/2023   Principle Diagnosis:  IDA secondary to intermittent GI blood loss and iron  malabsorption with Crohn's   Current Therapy:   IV iron  as indicated   Interim History:  Kevin Avila is here today with his mom for follow-up. He is doing well and is asymptomatic at this time. Hgb has increased from 10.7 to 14.3.  He tolerated IV iron  nicely. He has not noted any blood loss. No abnormal bruising, no petechiae.  No fever, chills, n/v, cough, rash, dizziness, SOB, chest pain, palpitations, abdominal pain or changes in bowel or bladder habits.  No swelling, tenderness, numbness or tingling in his extremities.  No falls or syncope reported.  Appetite and hydration are good. Weight is stable at   ECOG Performance Status: 0 - Asymptomatic  Medications:  Allergies as of 11/24/2023   No Known Allergies      Medication List        Accurate as of November 24, 2023  2:18 PM. If you have any questions, ask your nurse or doctor.          ACETAMINOPHEN PO Take by mouth.   Adalimumab -adaz 40 MG/0.4ML Soaj INJECT 1 PEN SUBCUTANEOUSLY  EVERY OTHER WEEK   Cholecalciferol 50 MCG (2000 UT) Caps Take 2,000 Units by mouth.   cyanocobalamin 1000 MCG tablet Commonly known as: VITAMIN B12 Take 1,000 mcg by mouth.   HYOSCYAMINE PO Take by mouth.   IRON  PO Take by mouth.   Melatonin 10 MG Tabs Take by mouth.   mercaptopurine  50 MG tablet Commonly known as: PURINETHOL    mesalamine 0.375 g 24 hr capsule Commonly known as: APRISO Take 375 mg by mouth daily.   MULTIVITAMIN/IRON  PO Take 1 tablet by mouth daily.   PROBIOTIC PO Take by mouth.        Allergies: No Known Allergies  Past Medical History, Surgical history, Social history, and Family History were reviewed and updated.  Review of Systems: All other 10 point review of systems is negative.   Physical Exam:  vitals were  not taken for this visit.   Wt Readings from Last 3 Encounters:  09/29/23 202 lb (91.6 kg)  09/15/23 200 lb (90.7 kg)  08/25/23 200 lb (90.7 kg)    Ocular: Sclerae unicteric, pupils equal, round and reactive to light Ear-nose-throat: Oropharynx clear, dentition fair Lymphatic: No cervical or supraclavicular adenopathy Lungs no rales or rhonchi, good excursion bilaterally Heart regular rate and rhythm, no murmur appreciated Abd soft, nontender, positive bowel sounds MSK no focal spinal tenderness, no joint edema Neuro: non-focal, well-oriented, appropriate affect Breasts: Deferred   Lab Results  Component Value Date   WBC 5.6 09/15/2023   HGB 10.7 (L) 09/15/2023   HCT 34.6 (L) 09/15/2023   MCV 67.2 Repeated and verified X2. (L) 09/15/2023   PLT 133.0 (L) 09/15/2023   Lab Results  Component Value Date   FERRITIN 4.4 (L) 09/15/2023   IRON  26 (L) 09/15/2023   TIBC 520.8 (H) 09/15/2023   UIBC 508 (H) 07/04/2023   IRONPCTSAT 5.0 (L) 09/15/2023   Lab Results  Component Value Date   RETICCTPCT 1.1 11/24/2023   RBC 5.75 11/24/2023   No results found for: KPAFRELGTCHN, LAMBDASER, KAPLAMBRATIO Lab Results  Component Value Date   IGA 238 02/20/2012   No results found for: TOTALPROTELP, ALBUMINELP, A1GS, A2GS, BETS, BETA2SER, GAMS, MSPIKE, SPEI   Chemistry      Component Value Date/Time  NA 138 07/04/2023 1646   K 4.0 07/04/2023 1646   CL 102 07/04/2023 1646   CO2 20 07/04/2023 1646   BUN 8 07/04/2023 1646   CREATININE 0.75 (L) 07/04/2023 1646      Component Value Date/Time   CALCIUM 9.6 07/04/2023 1646   ALKPHOS 98 07/04/2023 1646   AST 30 07/04/2023 1646   ALT 33 07/04/2023 1646   BILITOT 0.2 07/04/2023 1646       Impression and Plan: Kevin Avila is a very pleasant 23 yo caucasian gentleman IDA felt to be secondary to history of Crohn's with intermittent GI blood loss and iron  malabsorption.  Iron  studies are pending. We will replace if  needed.  Follow-up in 3 months.  Lauraine Pepper, NP 10/17/20252:18 PM

## 2023-12-08 ENCOUNTER — Inpatient Hospital Stay

## 2023-12-08 VITALS — BP 126/71 | HR 92 | Temp 98.3°F | Resp 18 | Wt 207.0 lb

## 2023-12-08 DIAGNOSIS — K50819 Crohn's disease of both small and large intestine with unspecified complications: Secondary | ICD-10-CM

## 2023-12-08 DIAGNOSIS — K909 Intestinal malabsorption, unspecified: Secondary | ICD-10-CM

## 2023-12-08 DIAGNOSIS — D500 Iron deficiency anemia secondary to blood loss (chronic): Secondary | ICD-10-CM

## 2023-12-08 DIAGNOSIS — D509 Iron deficiency anemia, unspecified: Secondary | ICD-10-CM | POA: Diagnosis not present

## 2023-12-08 MED ORDER — SODIUM CHLORIDE 0.9 % IV SOLN: INTRAVENOUS | Status: DC

## 2023-12-08 MED ORDER — SODIUM CHLORIDE 0.9% FLUSH: 10.0000 mL | Freq: Once | INTRAVENOUS | Status: DC | PRN

## 2023-12-08 MED ORDER — SODIUM CHLORIDE 0.9% FLUSH: 3.0000 mL | Freq: Once | INTRAVENOUS | Status: DC | PRN

## 2023-12-08 MED ORDER — IRON SUCROSE 300 MG IVPB - SIMPLE MED
300.0000 mg | Freq: Once | Status: AC
  Administered 2023-12-08: 300 mg via INTRAVENOUS
  Filled 2023-12-08: qty 300

## 2023-12-08 NOTE — Progress Notes (Signed)
Pt declined to stay for post-observation period

## 2023-12-08 NOTE — Patient Instructions (Signed)

## 2023-12-15 ENCOUNTER — Inpatient Hospital Stay

## 2023-12-22 ENCOUNTER — Inpatient Hospital Stay: Attending: Hematology & Oncology

## 2023-12-22 VITALS — BP 124/71 | HR 82 | Temp 98.1°F | Resp 18

## 2023-12-22 DIAGNOSIS — K909 Intestinal malabsorption, unspecified: Secondary | ICD-10-CM

## 2023-12-22 DIAGNOSIS — K50819 Crohn's disease of both small and large intestine with unspecified complications: Secondary | ICD-10-CM

## 2023-12-22 DIAGNOSIS — D500 Iron deficiency anemia secondary to blood loss (chronic): Secondary | ICD-10-CM

## 2023-12-22 MED ORDER — IRON SUCROSE 300 MG IVPB - SIMPLE MED
300.0000 mg | Freq: Once | Status: AC
  Administered 2023-12-22: 300 mg via INTRAVENOUS
  Filled 2023-12-22: qty 300

## 2023-12-22 MED ORDER — SODIUM CHLORIDE 0.9 % IV SOLN: INTRAVENOUS | Status: DC

## 2023-12-22 NOTE — Patient Instructions (Signed)

## 2024-01-11 ENCOUNTER — Encounter: Payer: Self-pay | Admitting: Pediatrics

## 2024-01-25 ENCOUNTER — Encounter: Payer: Self-pay | Admitting: Pediatrics

## 2024-02-23 ENCOUNTER — Inpatient Hospital Stay: Attending: Family

## 2024-02-23 ENCOUNTER — Inpatient Hospital Stay: Admitting: Family

## 2024-02-23 VITALS — BP 140/79 | HR 104 | Temp 97.9°F | Resp 19

## 2024-02-23 DIAGNOSIS — K909 Intestinal malabsorption, unspecified: Secondary | ICD-10-CM | POA: Diagnosis not present

## 2024-02-23 DIAGNOSIS — Z79620 Long term (current) use of immunosuppressive biologic: Secondary | ICD-10-CM | POA: Diagnosis not present

## 2024-02-23 DIAGNOSIS — D500 Iron deficiency anemia secondary to blood loss (chronic): Secondary | ICD-10-CM | POA: Diagnosis not present

## 2024-02-23 DIAGNOSIS — K50819 Crohn's disease of both small and large intestine with unspecified complications: Secondary | ICD-10-CM

## 2024-02-23 DIAGNOSIS — K5090 Crohn's disease, unspecified, without complications: Secondary | ICD-10-CM | POA: Insufficient documentation

## 2024-02-23 DIAGNOSIS — D508 Other iron deficiency anemias: Secondary | ICD-10-CM | POA: Insufficient documentation

## 2024-02-23 LAB — CBC WITH DIFFERENTIAL (CANCER CENTER ONLY)
Abs Immature Granulocytes: 0.03 K/uL (ref 0.00–0.07)
Basophils Absolute: 0.1 K/uL (ref 0.0–0.1)
Basophils Relative: 1 %
Eosinophils Absolute: 0.1 K/uL (ref 0.0–0.5)
Eosinophils Relative: 2 %
HCT: 45.5 % (ref 39.0–52.0)
Hemoglobin: 15.2 g/dL (ref 13.0–17.0)
Immature Granulocytes: 1 %
Lymphocytes Relative: 30 %
Lymphs Abs: 2 K/uL (ref 0.7–4.0)
MCH: 27.5 pg (ref 26.0–34.0)
MCHC: 33.4 g/dL (ref 30.0–36.0)
MCV: 82.4 fL (ref 80.0–100.0)
Monocytes Absolute: 0.5 K/uL (ref 0.1–1.0)
Monocytes Relative: 8 %
Neutro Abs: 3.8 K/uL (ref 1.7–7.7)
Neutrophils Relative %: 58 %
Platelet Count: 172 K/uL (ref 150–400)
RBC: 5.52 MIL/uL (ref 4.22–5.81)
RDW: 13 % (ref 11.5–15.5)
WBC Count: 6.4 K/uL (ref 4.0–10.5)
nRBC: 0 % (ref 0.0–0.2)

## 2024-02-23 LAB — CMP (CANCER CENTER ONLY)
ALT: 58 U/L — ABNORMAL HIGH (ref 0–44)
AST: 28 U/L (ref 15–41)
Albumin: 4.7 g/dL (ref 3.5–5.0)
Alkaline Phosphatase: 95 U/L (ref 38–126)
Anion gap: 10 (ref 5–15)
BUN: 10 mg/dL (ref 6–20)
CO2: 26 mmol/L (ref 22–32)
Calcium: 9.6 mg/dL (ref 8.9–10.3)
Chloride: 103 mmol/L (ref 98–111)
Creatinine: 0.83 mg/dL (ref 0.61–1.24)
GFR, Estimated: >60 mL/min
Glucose, Bld: 115 mg/dL — ABNORMAL HIGH (ref 70–99)
Potassium: 3.9 mmol/L (ref 3.5–5.1)
Sodium: 139 mmol/L (ref 135–145)
Total Bilirubin: 0.5 mg/dL (ref 0.0–1.2)
Total Protein: 7.6 g/dL (ref 6.5–8.1)

## 2024-02-23 LAB — RETICULOCYTES
Immature Retic Fract: 8.9 % (ref 2.3–15.9)
RBC.: 5.5 MIL/uL (ref 4.22–5.81)
Retic Count, Absolute: 87.5 K/uL (ref 19.0–186.0)
Retic Ct Pct: 1.6 % (ref 0.4–3.1)

## 2024-02-23 LAB — IRON AND IRON BINDING CAPACITY (CC-WL,HP ONLY)
Iron: 110 ug/dL (ref 45–182)
Saturation Ratios: 26 % (ref 17.9–39.5)
TIBC: 421 ug/dL (ref 250–450)
UIBC: 311 ug/dL

## 2024-02-23 LAB — FERRITIN: Ferritin: 100 ng/mL (ref 24–336)

## 2024-02-23 NOTE — Progress Notes (Signed)
 " Hematology and Oncology Follow Up Visit  Kevin Avila [id number] 07-Apr-2000 23 y.o. 02/23/2024   Principle Diagnosis:  IDA secondary to intermittent GI blood loss and iron  malabsorption with Crohn's    Current Therapy:        IV iron  as indicated    Interim History:  Kevin Avila is here today with his mom for follow-up. He is doing quite well. No recent flares with his Crohn's.  No obvious blood loss noted. Hgb > 15! He has routine follow-up with GI on 1/30.  No c/o fever, chills, n/v, cough, rash, dizziness, SOB, chest pain, palpitations, abdominal pain or changes in bowel or bladder habits.  No swelling, tenderness, numbness or tingling in his extremities.  No falls or syncope.  Appetite and hydration are good. Weight is stable at 207 lbs.   ECOG Performance Status: 0 - Asymptomatic  Medications:  Allergies as of 02/23/2024   No Known Allergies      Medication List        Accurate as of February 23, 2024  2:18 PM. If you have any questions, ask your nurse or doctor.          ACETAMINOPHEN PO Take by mouth. What changed:  how much to take when to take this reasons to take this   Adalimumab -adaz 40 MG/0.4ML Soaj INJECT 1 PEN SUBCUTANEOUSLY  EVERY OTHER WEEK   Cholecalciferol 50 MCG (2000 UT) Caps Take 2,000 Units by mouth.   cyanocobalamin 1000 MCG tablet Commonly known as: VITAMIN B12 Take 1,000 mcg by mouth.   IRON  PO Take by mouth.   MULTIVITAMIN/IRON  PO Take 1 tablet by mouth daily.   PROBIOTIC PO Take by mouth.        Allergies: Allergies[1]  Past Medical History, Surgical history, Social history, and Family History were reviewed and updated.  Review of Systems: All other 10 point review of systems is negative.   Physical Exam:  oral temperature is 97.9 F (36.6 C). His blood pressure is 140/79 (abnormal) and his pulse is 104 (abnormal). His respiration is 19 and oxygen saturation is 97%.   Wt Readings from Last 3 Encounters:   12/08/23 207 lb (93.9 kg)  11/24/23 200 lb 6.4 oz (90.9 kg)  09/29/23 202 lb (91.6 kg)    Ocular: Sclerae unicteric, pupils equal, round and reactive to light Ear-nose-throat: Oropharynx clear, dentition fair Lymphatic: No cervical or supraclavicular adenopathy Lungs no rales or rhonchi, good excursion bilaterally Heart regular rate and rhythm, no murmur appreciated Abd soft, nontender, positive bowel sounds MSK no focal spinal tenderness, no joint edema Neuro: non-focal, well-oriented, appropriate affect Breasts: Deferred   Lab Results  Component Value Date   WBC 6.4 02/23/2024   HGB 15.2 02/23/2024   HCT 45.5 02/23/2024   MCV 82.4 02/23/2024   PLT 172 02/23/2024   Lab Results  Component Value Date   FERRITIN 64 11/24/2023   IRON  39 (L) 11/24/2023   TIBC 479 (H) 11/24/2023   UIBC 440 11/24/2023   IRONPCTSAT 8 (L) 11/24/2023   Lab Results  Component Value Date   RETICCTPCT 1.6 02/23/2024   RBC 5.50 02/23/2024   RBC 5.52 02/23/2024   No results found for: KPAFRELGTCHN, LAMBDASER, KAPLAMBRATIO Lab Results  Component Value Date   IGA 238 02/20/2012   No results found for: STEPHANY RINGS, A1GS, A2GS, BETS, BETA2SER, GAMS, MSPIKE, SPEI   Chemistry      Component Value Date/Time   NA 139 02/23/2024 1340   NA 138 07/04/2023 1646  K 3.9 02/23/2024 1340   CL 103 02/23/2024 1340   CO2 26 02/23/2024 1340   BUN 10 02/23/2024 1340   BUN 8 07/04/2023 1646   CREATININE 0.83 02/23/2024 1340      Component Value Date/Time   CALCIUM 9.6 02/23/2024 1340   ALKPHOS 95 02/23/2024 1340   AST 28 02/23/2024 1340   ALT 58 (H) 02/23/2024 1340   BILITOT 0.5 02/23/2024 1340       Impression and Plan: Kevin Avila is a very pleasant 24 yo caucasian gentleman IDA felt to be secondary to history of Crohn's with intermittent GI blood loss and iron  malabsorption.  Iron  studies are pending. We will replace if needed.  Follow-up in 3 months.  Lauraine Pepper, NP 1/16/20262:18 PM     [1] No Known Allergies  "

## 2024-02-26 ENCOUNTER — Encounter: Payer: Self-pay | Admitting: Family

## 2024-03-03 NOTE — Progress Notes (Unsigned)
 "  [facility] Return Visit   Referring Provider Darilyn Rosalva Bruckner, PA-C [id number] [place],  [place] [address]  Primary Care Provider Podraza, Rosalva Bruckner, PA-C  Patient Profile: Kevin Avila is a 24 y.o. male who returns to the [facility] for follow-up of the problem(s) noted below.  Problem List: 1. Stricturing small bowel, colonic and perianal fistulizing Crohn's disease status post laparoscopic ileocecectomy 06/2012; EUA with I&D perianal abscess, seton placement 12/2020 2. History of elevated LFTs, possible MASLD 3. History of C. Diff infection 04/2015  4. History of vitamin D  insufficiency 5. History of IDA 6. Rectal bleeding   History of Present Illness    Discussed the use of AI scribe software for clinical note transcription with the patient, who gave verbal consent to proceed.  History of Present Illness Kevin Avila is a 24 year old male who returns to the gastroenterology office for follow-up of Crohn's disease with perianal fistula, IDA and rectal bleeding  Current GI Meds  Adalimumab -ADAZ (Hyrimoz ) 40 mg SQ Q 14 days (Induction 01/05/2021. 09/2022: [facility] ADA 13, Ab <25) Iron  supplement   Interval History  Kevin Avila presents to the office today accompanied by his mother who provides history in conjunction with him  Crohn's Disease -- States that he has been doing well since the summer with no active flare symptoms -- Continues biosimilar Hyrimoz  without any issues  -- Crohn's restaged in last 6 months 2/2 IDA  -- Fecal cal 07/2023 normal at 55 -- MRE 09/2023 - no active disease -- EGD/Colonoscopy 09/2023-no H. Pylori, celiac disease or active Crohn's disease; mild marginal ulceration at ileocolic anastomosis -- Followed by hematology and had 6 iron  infusions  -- Currently having 2-4 formed bowel movements a day  -- Denies fecal urgency, tenesmus or nocturnal bowel movements -- No abdominal pain or  cramping -- Denies upper GI tract symptoms of GERD, nausea, vomiting, dysphagia or odynophagia  -- States that drainage from his perianal fistula is overall stable -- Last seen by Dr. Florie at [facility] 09/2022 -anoscopy did not show any active disease -Kevin Avila deferred EUA with consideration of additional setons -- No perianal pain or recurrent perianal abscesses  -- No intercurrent infectious illnesses  Rectal bleeding -- New painless rectal bleeding episode yesterday after a normal bowel movement -- Large amount of blood on wiping, persisted for 10-15 minutes, required multiple wipes -- Later that day, smaller amount of bleeding after another bowel movement, stopped after two to three wipes -- No rectal bleeding with today's normal bowel movement -- No prior episodes of this degree of bleeding from perianal fistula; does not believe bleeding originated from  IDA -- Completed six iron  infusions in December 2025 -- Improved energy and exercise tolerance since infusions -- Not taking oral iron  and no further infusions since December -- Increased exercise tolerance, including more frequent Stairmaster use  GI Review of Symptoms Significant for a small amount of drainage from perianal fistula and recent rectal bleeding. Otherwise negative.  General Review of Systems  Review of systems is significant for the pertinent positives and negatives as listed per the HPI.  Full ROS is otherwise negative.  Inflammatory Bowel Disease History  12/2011 - abdominal pain, diarrhea, weight loss, fever and fatigue 02/2012 - CTAP w/ inflamed SB and colon c/w enterocolitis; EGD/colonoscopy - EGD nl, ulcers throughout colon, ICV not visualized; SBFT w/ ileal dz c/w Crohn's disease and ileal stricture; tx'd with prednisone  and 6-MP with incomplete improvement 06/2012 - EGD/Colonoscopy -  EGD nl; Colonoscopy -patchy ulcerated mucosa at the hepatic flexure and ascending colon, TI friable and ulcerated.; referred for  ileocecectomy - 15 cm TI, 18 cm cecum resected; continued 6-MP 50 mg daily post-op 08/2012 - fever and loss of appetite, fecal cal 577, MRE w/ ? Small segment of inflamed small bowel --> Apriso added 10/2017 - Normal fecal cal 06/2013 - EGD/colonoscopy - EGD nl; Colonoscopy - nl colon tissue with patent healthy anastomosis 02/2014 - Elevated ALT 67, AST 49; 6-MP decreased to 37.5 mg daily, labs for chronic hepatidities nl, possible NAFLD 02/2015 - Significant iron  deficiency anemia necessitating iron  infusions 04/2015 - C. Diff infection tx'd w/ flagyl 2018 - Growth concerns, BA nl, seen by endocrine and felt to be related to Crohn's disease 09/2016 - MRE -single short segment of jejunal loop with mild enhancement - ? Artifact versus focal enteritis; ? Perianal lesion w/o abscess 06/2019 - EGD - normal; Colonoscopy -essentially normal with a few scattered exudate in the neoterminal ileum- Rutgeerts i1. 10/2019 - D/C 6-MP --> change to MTX given age and male gender 12/2020 - Hospitalization for I&D, seton placement of perianal abscess --> initiate biologic therapy with Humira  + MTX 11/2021 - Colonoscopy performed with endoscopic and histologic remission; Stopped Apriso   IBD Medication History Prednisone  - at dx in 2014 Apriso - added 08/2012 (dc'd in 2023) 6-MP - 2014-2021 - D/C'd due to male gener/age MTX (oral) - initiated 10/2019 Humira  (adalimumab ) - initiated 12/2020 for development of perianal fistulizing disease  Past Medical History   Past Medical History:  Diagnosis Date   Abdominal pain, other specified site    Allergy    hx of seasonal allergies   Anemia    Crohn's disease (HCC)      Past Surgical History   Past Surgical History:  Procedure Laterality Date   COLONOSCOPY  03/02/2012   Procedure: COLONOSCOPY;  Surgeon: Fairy VEAR Gaskins, MD;  Location: [facility] OR;  Service: Gastroenterology;  Laterality: N/A;   COLONOSCOPY  03/02/2012   Procedure: COLONOSCOPY;  Surgeon: Fairy VEAR Gaskins, MD;  Location: [facility] OR;  Service: Gastroenterology;;   ESOPHAGOGASTRODUODENOSCOPY  03/02/2012   Procedure: ESOPHAGOGASTRODUODENOSCOPY (EGD);  Surgeon: Fairy VEAR Gaskins, MD;  Location: [facility] OR;  Service: Gastroenterology;  Laterality: N/A;   INGUINAL HERNIA REPAIR     TESTICLE SURGERY       Allergies and Medications  No Known Allergies    Current Meds  Medication Sig   ACETAMINOPHEN PO Take by mouth. (Patient taking differently: Take 500 mg by mouth daily as needed.)   Adalimumab -adaz 40 MG/0.4ML SOAJ INJECT 1 PEN SUBCUTANEOUSLY  EVERY OTHER WEEK   Cholecalciferol 50 MCG (2000 UT) CAPS Take 2,000 Units by mouth.   cyanocobalamin (VITAMIN B12) 1000 MCG tablet Take 1,000 mcg by mouth.   Probiotic Product (PROBIOTIC PO) Take by mouth.     Family History   Family History  Problem Relation Age of Onset   Irritable bowel syndrome Mother    Cholelithiasis Mother    Depression Mother    Hearing loss Mother    Diabetes Father    Hyperlipidemia Father    Hypertension Father    Seizures Sister    Asthma Maternal Aunt    Depression Maternal Aunt    Arthritis Maternal Grandmother    Irritable bowel syndrome Maternal Grandfather    Diabetes Paternal Grandmother    Cancer Paternal Grandfather    Liver cancer Neg Hx    Colon polyps Neg Hx  Colon cancer Neg Hx    Esophageal cancer Neg Hx    Rectal cancer Neg Hx    Stomach cancer Neg Hx     Social History   Social History   Tobacco Use   Smoking status: Never   Smokeless tobacco: Never  Vaping Use   Vaping status: Never Used  Substance Use Topics   Alcohol use: Never   Drug use: Never   Kevin Avila reports that he has never smoked. He has never used smokeless tobacco. He reports that he does not drink alcohol and does not use drugs.  Vital Signs and Physical Examination   Vitals:   03/08/24 1339  BP: 102/68  Pulse: 83  SpO2: 97%    Body mass index is 29.43 kg/m. Weight: 211 lb (95.7 kg)  General: Well developed,  well nourished, no acute distress Head: Normocephalic and atraumatic Eyes: Sclerae anicteric, EOMI Lungs: Clear throughout to auscultation Heart: Regular rate and rhythm; No murmurs, rubs or bruits Abdomen: Soft, non tender and non distended. No masses, hepatosplenomegaly or hernias noted. Normal Bowel sounds Rectal: Deferred Musculoskeletal: Symmetrical with no gross deformities    Review of Data  The following data was reviewed at the time of this encounter:  Laboratory Studies      Latest Ref Rng & Units 02/23/2024    1:40 PM 11/24/2023    1:46 PM 09/15/2023   10:44 AM  CBC  WBC 4.0 - 10.5 K/uL 6.4  8.4  5.6   Hemoglobin 13.0 - 17.0 g/dL 84.7  85.6  89.2   Hematocrit 39.0 - 52.0 % 45.5  43.8  34.6   Platelets 150 - 400 K/uL 172  173  133.0     Lab Results  Component Value Date   LIPASE 29 06/10/2012      Latest Ref Rng & Units 02/23/2024    1:40 PM 11/24/2023    1:46 PM 07/04/2023    4:46 PM  CMP  Glucose 70 - 99 mg/dL 884  94  82   BUN 6 - 20 mg/dL 10  10  8    Creatinine 0.61 - 1.24 mg/dL 9.16  9.15  9.24   Sodium 135 - 145 mmol/L 139  138  138   Potassium 3.5 - 5.1 mmol/L 3.9  4.0  4.0   Chloride 98 - 111 mmol/L 103  102  102   CO2 22 - 32 mmol/L 26  23  20    Calcium 8.9 - 10.3 mg/dL 9.6  9.8  9.6   Total Protein 6.5 - 8.1 g/dL 7.6  7.9  7.8   Total Bilirubin 0.0 - 1.2 mg/dL 0.5  0.3  0.2   Alkaline Phos 38 - 126 U/L 95  90  98   AST 15 - 41 U/L 28  27  30    ALT 0 - 44 U/L 58  36  33    Lab Results  Component Value Date   IRON  110 02/23/2024   TIBC 421 02/23/2024   FERRITIN 100 02/23/2024    IBD Labs  Prebiologic Labs Last prebiologic labs ordered in 2022 and were negative-updated labs ordered for 2024  Therapeutic Drug Monitoring TPMT genotype homozygous normal Thiopurine metabolite levels:  Date:                6-TGN       6-MMP  Biologic level and antibodies:  Fecal Calprotectin 08/2012: 577 10/2012: 150.9 10/2014: 85 06/2020 : 89 07/2023:   55   Imaging Studies  MRE 10/01/2023 1. Minimal distension of bowel significantly limits evaluation. Within this context there is no evidence of acute bowel inflammation. No findings suggestive of stricture or penetrating disease. Consider further evaluation by repeat CT or MR enterography with increased enteric contrast administration/ingestion. 2. Prominent mesenteric lymph nodes, nonspecific but likely reactive. 3. Colonic stool burden may reflect constipation.  MRE 09/22/2022 1.  No evidence of active inflammation within the bowel. Persistent  perianal sinus tract extending from the left anal verge into the  superficial left buttock with some soft tissue thickening in this region.  No drainable fluid collection.   CTAP 12/02/2020 1. Perianal sinus tract extending from the left anal verge into the superficial left buttock measuring up to 3.8 cm. No drainable fluid collection. 2. Diffuse mesenteric lymphadenopathy without evidence of focal disease in the small bowel. 3. Circumferential thickening of the rectum which may be secondary to underdistention or colitis.  MRE 09/2016 1. Single short segment loop of jejunum in the left hemiabdomen demonstrates mild mural enhancement which may be artifactual or represent mild/early focal enteritis. There is no surrounding inflammatory change or abscess. Recommend correlation with patient symptoms and attention on follow up. 2. There is linear T2 prolongation and enhancement in the perineum to the left of the rectum that is nonspecific but is new compared to 2016. Consider direct visualization with physical exam to exclude cellulitis or perianal fistula. 3. No bowel fistulae, strictures, or intraabdominal fluid collections.   Bone age 35/2018 - Chronological age 47 years and 3 months, bone age 24, this is considered normal  MRE 2016 Impression: Stable post surgical changes status post ileocecectomy. No MR evidence of active inflammatory  bowel disease.   Liver US  07/2014  Mildly increased liver echogenicity, consistent with mild hepatic steatosis.   MRE 08/29/12  Postsurgical changes consistent with removal of previously seen diseased distal ileum and cecum. No evidence of residual diseased bowel adjacent to the surgical site. Short segment (5-6 cm) of small bowel within the mid abdomen that remains collapsed throughout the exam with suggested wall thickening and hyperenhancement. The lack of dynamic change of this loop of bowel over multiple sequences and apparent wall thickening and enhancement raise the possibility of an additional site of inflammatory bowel disease, and attention on follow up imaging is recommended.   MRE 05/15/12  Wall thickening and hyperenhancement of the distal ileum and terminal ileum encompassing approximately 15 cm. There was also focal hyperenhancement of a small bowel loop in the left abdomen   SBFT 02/28/2012  12 in of terminal ileal involvement with ulceration and marked did bowel wall thickening suggestive of Crohn's disease. Just proximal to the involve segment of distal and terminal ileum there was some dilatation of bowel which could indicate a distal ileal stricture. There was question of the duodenum old versus polyp.  CT scan of the abdomen and pelvis 02/14/2012  Wall thickening throughout multiple loops of small bowel as well as much of the colon suggestive enterocolitis. There was also evidence of multiple enlarged lymph nodes throughout the right abdomen particularly in the right lower quadrant.   GI Procedures and Studies  EGD/Colonoscopy 09/2023 EGD - normal Colonoscopy -patent ileocolonic anastomosis with marginal ulceration stable prior to previous colonoscopy, Rutgeert's i0 Path: Minimal chronic gastritis negative for H. Pylori; normal duodenal, ileal and colonic biopsies  Colonoscopy 11/2021 - Perianal fistula found on perianal exam on left buttock. No fluctuance or active  drainage.Patent ileo-colonic anastomosis in the transverse colon, characterized by mild marginal erosion.  The examined portion of the neo-terminal ileum was normal. Rutgeerts i0. Biopsied. The distal rectum and anal verge are normal on retroflexion view. Biopsies for surveillance were taken from the transverse colon, descending colon, sigmoid colon and rectum. Histologically normal and in remission.  EGD/Colonoscopy 06/2019 EGD - endoscopically and histologically normal Colonoscopy - essentially normal with a few scattered exudate in the neoterminal ileum- Rutgeerts i1.   EGD/Colonoscopy 06/2013  EGD - endoscopically normal.  Colonoscopy showed normal colonic tissue with a patent and healthy appearing anastomosis. Biopsies of the colon were normal without evidence of active disease.   EGD/Colonoscopy 06/2012  EGD - endoscopically normal. Gastric biopsies showed H. Pylori negative chronic gastritis.  Colonoscopy showed a patchy area of ulcerated mucosa with pseudopolyposis at the hepatic flexure and ascending colon. The terminal ileum appeared friable and ulcerated. Biopsies of the terminal ileum showed chronic active enteritis. In the cecum there was mild architectural distortion. The ascending colon showed scattered reactive changes. The transverse colon was normal. Focal paneth metaplasia was noted in the sigmoid and descending colon. There were no obvious changes in the rectum.   EGD/Colonoscopy 02/2012 EGD - endoscopically normal esophagus stomach and duodenum Colonoscopy - perianal and distal rectal examination were normal. There were sporadic isolated ulcers in the distal colon but market serpiginous ulcers to the transverse and ascending colon. The cecum was relatively free of visual abnormality although the ileocecal valve was unable to be visualized.   Clinical Impression  It is my clinical impression that Kevin Avila is a 24 y.o. male with;  Stricturing small bowel, colonic and perianal  fistulizing Crohn's disease status post laparoscopic ileocecectomy 06/2012; EUA with I&D perianal abscess, seton placement 12/2020 History of elevated LFTs, possible MASLD History of C. difficile infection 04/2015 History of vitamin D  insufficiency History of  IDA Rectal bleeding  Idan presented with symptoms of abdominal pain, diarrhea, weight loss and fever in late 2013. In early 2015 he was diagnosed with stricturing small bowel and colonic Crohn's disease. He had a significant ileal stricture which was not felt to be amenable to medical therapy. As such he was referred for laparoscopic ileocecectomy in 06/2012.  Postoperatively, he was maintained on 6-MP and Apriso 2014-2021.  Although he is TPMT genotype normal, it is noted that he developed elevated liver enzymes on 6-MP 50 mg daily and was subsequently remained on 6-MP 37.5 mg daily. Additional work-up for chronic hepatitides ruled out other etiologies. There is a pertinent family history of fatty liver in his father and this has also been considered as a possibility.  His course has been noteworthy for anemia requiring iron  infusions, growth delay and a C. difficile infection in 2017.    Restaging EGD and colonoscopy 06/2019 were normal. Avila was transitioned from 6-MP to methotrexate in September 2021 due to his age and male gender. He tolerated methotrexate well. His ALT was minimally elevated at 51. He has a possible remote history of fatty liver.   In October 2022, Kevin Avila presented with active perianal fistulizing Crohn's disease requiring hospitalization with incision and drainage and seton placement of a left perianal abscess. He was medically managed with antibiotics and began treatment with Humira  in November 2022. He is currently doing well on maintenance Hyrimoz  40mg  q2weeks and he continues to feel well without any evidence of a flare.   Laboratory studies summer 2025 were noteworthy for IDA.  Restaging MR ED, upper endoscopy and  colonoscopy did not show any evidence of active Crohn's disease, celiac disease or  H. pylori infection.  There was mild marginal ulceration at his ileocolic anastomosis which was considered as a possible contributor to microscopic blood loss.  He has been seen by hematology and under gone 6 iron  infusions with improvement in his hemoglobin and iron  stores.  At today's visit he reports feeling well without any active symptoms of Crohn's disease.  He experiences scant intermittent perianal drainage but no recurrent abscess.  Energy has improved since undergoing iron  infusions.  In Spring 2024, he started a new job (increased stress) and Hyrimoz  (Humira  biosimilar) and noticed abdominal pain, increased stool frequency and perianal drainage for about a week. He attributes the symptoms to increased stress.  He was seen by Dr. Florie 09/2022 who did not find any evidence of active proctitis on anoscopy.  His perianal fistulizing disease appears to be stable.  At today's visit, Kevin Avila overall appears to be in clinical remission.  He continues to experience a scant amount of drainage from his perianal fistula.  We discussed that 50% of perianal fistulas will heal entirely, however the other half will remain present with varying amounts of drainage.  He is careful to monitor for signs and symptoms of abscess.  We discussed updating immune suppressant monitoring laboratory studies as well as therapeutic drug monitoring test today..  Plan  Continue Hyrimoz  40 mg subcutaneously q. 8 weeks Future labs at local [facility]: CBC, CMP, ESR, CRP, iron  panel, vitamin D , vitamin B12, folate, prebiologic laboratory studies Monitor perianal region for clinical changes Monitor weight and anthropometrics    IBD Health Maintenance  Vaccinations Influenza: 2024 PCV13: PPSV23: COVID19: HAV/HBV: Shingles: HPV:  DEXA PRN  Eye Exam PRN  Skin Exam Annual skin exam on IS  Surveillance Colonoscopy Normal 11/2021; next due fall  2026  Tobacco Use None  Depression Screen    Planned Follow Up 6 months  The patient or caregiver verbalized understanding of the material covered, with no barriers to understanding. All questions were answered. Patient or caregiver is agreeable with the plan outlined above.    It was a pleasure to see Kevin Avila.  If you have any questions or concerns regarding this evaluation, do not hesitate to contact me.  Inocente Hausen, MD [facility]  "

## 2024-03-08 ENCOUNTER — Other Ambulatory Visit

## 2024-03-08 ENCOUNTER — Encounter: Payer: Self-pay | Admitting: Pediatrics

## 2024-03-08 ENCOUNTER — Ambulatory Visit: Admitting: Pediatrics

## 2024-03-08 VITALS — BP 102/68 | HR 83 | Ht 71.0 in | Wt 211.0 lb

## 2024-03-08 DIAGNOSIS — K50919 Crohn's disease, unspecified, with unspecified complications: Secondary | ICD-10-CM

## 2024-03-08 DIAGNOSIS — D509 Iron deficiency anemia, unspecified: Secondary | ICD-10-CM

## 2024-03-08 DIAGNOSIS — Z7960 Long term (current) use of unspecified immunomodulators and immunosuppressants: Secondary | ICD-10-CM | POA: Diagnosis not present

## 2024-03-08 DIAGNOSIS — K625 Hemorrhage of anus and rectum: Secondary | ICD-10-CM | POA: Diagnosis not present

## 2024-03-08 DIAGNOSIS — K50913 Crohn's disease, unspecified, with fistula: Secondary | ICD-10-CM

## 2024-03-08 LAB — FOLATE: Folate: 11.5 ng/mL

## 2024-03-08 LAB — VITAMIN D 25 HYDROXY (VIT D DEFICIENCY, FRACTURES): VITD: 13.44 ng/mL — ABNORMAL LOW (ref 30.00–100.00)

## 2024-03-08 LAB — HIGH SENSITIVITY CRP: CRP, High Sensitivity: 1.82 mg/L (ref 0.200–5.000)

## 2024-03-08 LAB — VITAMIN B12: Vitamin B-12: 248 pg/mL (ref 211–911)

## 2024-03-08 LAB — SEDIMENTATION RATE: Sed Rate: 10 mm/h (ref 0–15)

## 2024-03-08 NOTE — Patient Instructions (Signed)
 Your provider has requested that you go to the basement level for lab work before leaving today. Press B on the elevator. The lab is located at the first door on the left as you exit the elevator.  Please follow up with Dr. Suzann or one of her PA's in 6 months.  _______________________________________________________  If your blood pressure at your visit was 140/90 or greater, please contact your primary care physician to follow up on this.  _______________________________________________________  If you are age 22 or older, your body mass index should be between 23-30. Your Body mass index is 29.43 kg/m. If this is out of the aforementioned range listed, please consider follow up with your Primary Care Provider.  If you are age 35 or younger, your body mass index should be between 19-25. Your Body mass index is 29.43 kg/m. If this is out of the aformentioned range listed, please consider follow up with your Primary Care Provider.   ________________________________________________________  The [facility] providers would like to encourage you to use MYCHART to communicate with providers for non-urgent requests or questions.  Due to long hold times on the telephone, sending your provider a message by [facility] may be a faster and more efficient way to get a response.  Please allow 48 business hours for a response.  Please remember that this is for non-urgent requests.  _______________________________________________________  [facility] is using a team-based approach to care.  Your team is made up of your doctor and two to three APPS. Our APPS (Nurse Practitioners and Physician Assistants) work with your physician to ensure care continuity for you. They are fully qualified to address your health concerns and develop a treatment plan. They communicate directly with your gastroenterologist to care for you. Seeing the Advanced Practice Practitioners on your physician's team can help  you by facilitating care more promptly, often allowing for earlier appointments, access to diagnostic testing, procedures, and other specialty referrals.

## 2024-03-11 ENCOUNTER — Ambulatory Visit: Payer: Self-pay | Admitting: Pediatrics

## 2024-03-11 ENCOUNTER — Other Ambulatory Visit: Payer: Self-pay | Admitting: Pediatrics

## 2024-03-11 MED ORDER — VITAMIN D (ERGOCALCIFEROL) 1.25 MG (50000 UNIT) PO CAPS: 50000.0000 [IU] | ORAL_CAPSULE | ORAL | 1 refills | Status: AC

## 2024-05-31 ENCOUNTER — Inpatient Hospital Stay

## 2024-05-31 ENCOUNTER — Inpatient Hospital Stay: Admitting: Family
